# Patient Record
Sex: Male | Born: 1980 | Race: White | Hispanic: No | Marital: Married | Smoking: Never smoker
Health system: Southern US, Community
[De-identification: ages and names within clinical notes are randomized; demographics above are authoritative.]

## PROBLEM LIST (undated history)

## (undated) DIAGNOSIS — K219 Gastro-esophageal reflux disease without esophagitis: Secondary | ICD-10-CM

## (undated) DIAGNOSIS — K589 Irritable bowel syndrome without diarrhea: Secondary | ICD-10-CM

## (undated) HISTORY — PX: HEMORRHOID SURGERY: SHX153

## (undated) HISTORY — PX: TONSILLECTOMY: SUR1361

## (undated) HISTORY — PX: FRACTURE SURGERY: SHX138

---

## 2008-04-20 ENCOUNTER — Emergency Department: Payer: Self-pay | Admitting: Internal Medicine

## 2009-02-09 ENCOUNTER — Emergency Department: Payer: Self-pay | Admitting: Emergency Medicine

## 2009-02-12 ENCOUNTER — Emergency Department: Payer: Self-pay | Admitting: Emergency Medicine

## 2009-06-16 ENCOUNTER — Emergency Department: Payer: Self-pay | Admitting: Emergency Medicine

## 2010-04-08 ENCOUNTER — Emergency Department: Payer: Self-pay | Admitting: Emergency Medicine

## 2010-04-17 ENCOUNTER — Emergency Department: Payer: Self-pay | Admitting: Emergency Medicine

## 2012-08-14 ENCOUNTER — Emergency Department: Payer: Self-pay | Admitting: Internal Medicine

## 2012-08-18 ENCOUNTER — Ambulatory Visit: Payer: Self-pay | Admitting: Orthopedic Surgery

## 2014-05-11 NOTE — Op Note (Signed)
PATIENT NAME:  Ricardo Hammond, Ricardo Hammond MR#:  [id number] DATE OF BIRTH:  [birth year]  DATE OF PROCEDURE:  08/18/2012  PREOPERATIVE DIAGNOSIS:  Fourth and fifth metacarpal fractures.  POSTOPERATIVE DIAGNOSIS:  Fourth and fifth metacarpal fractures.   PROCEDURE:  Closed reduction and percutaneous pinning, right fourth and fifth metacarpal fractures.   ANESTHESIA:  General.   SURGEON:  Leitha SchullerMichael J. Myles Mallicoat, [name]   DESCRIPTION OF PROCEDURE:  The patient was brought to the Operating Room and after adequate anesthesia was obtained the hand was prepped and draped in the usual sterile fashion, appropriate patient identification and procedures were carried out.  On examination with traction and dorsal pressure to the proximal end of the fifth metacarpal, the fifth metacarpal fracture was reduced.  A single K wire was then inserted across the base of the distal fragment into the base of the fourth and third metacarpals.  This appeared to give very good rigidity to this fracture.  Next, the fourth metacarpal distal fracture was reduced with the MCP joint and IP joints in flexion and dorsally directed pressure applied.  Two K wires were then inserted across the fifth metacarpal head and neck into the fourth.  With these two K wires there appeared to be a stable fixation of the fourth metacarpal fracture to stress views with range of motion of the fingers.  All pins were cut short and bent over at this time.  Xeroform was placed around the base of the pins along with 4 x 4's followed by Webril and a volar splint.   ESTIMATED BLOOD LOSS:  Minimal.   COMPLICATIONS:  None.   SPECIMEN:  None.   IMPLANTS:  K wire x 3.    CONDITION:  To recovery room, stable.      ____________________________ Leitha SchullerMichael J. Lavar Rosenzweig, MD mjm:ea D: 08/18/2012 23:18:14 ET T: 08/18/2012 23:37:12 ET JOB#: [id number]  cc: Leitha SchullerMichael J. Jermaine Tholl, MD, <Dictator> Leitha SchullerMICHAEL J Shatarra Wehling MD ELECTRONICALLY SIGNED 08/19/2012 12:52

## 2015-03-11 IMAGING — CR RIGHT HAND - COMPLETE 3+ VIEW
1 series · 3 of 3 positions shown · non-contrast
Comparison: none

REASON FOR EXAM: punched a wall
COMMENTS:

PROCEDURE:     DXR - DXR HAND RT COMPLETE W/OBLIQUES  - August 14, 2012  [DATE]
RESULT:

[Series 1: pa · 0.17mm/px · 3 of 3 slices shown]
[im 1/3]
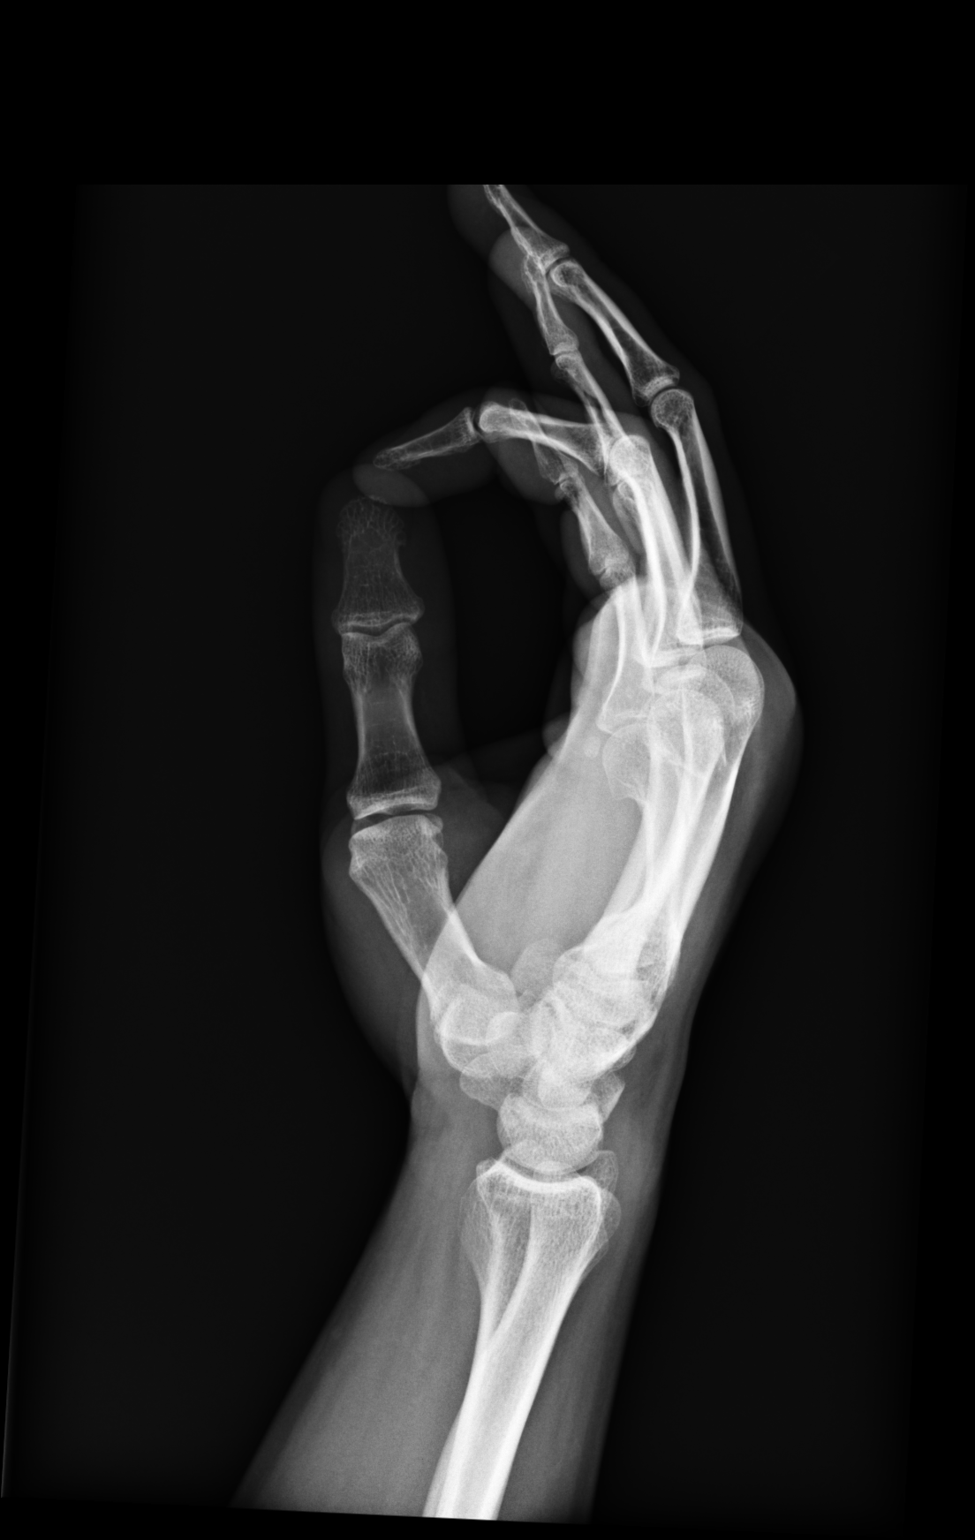
[im 2/3]
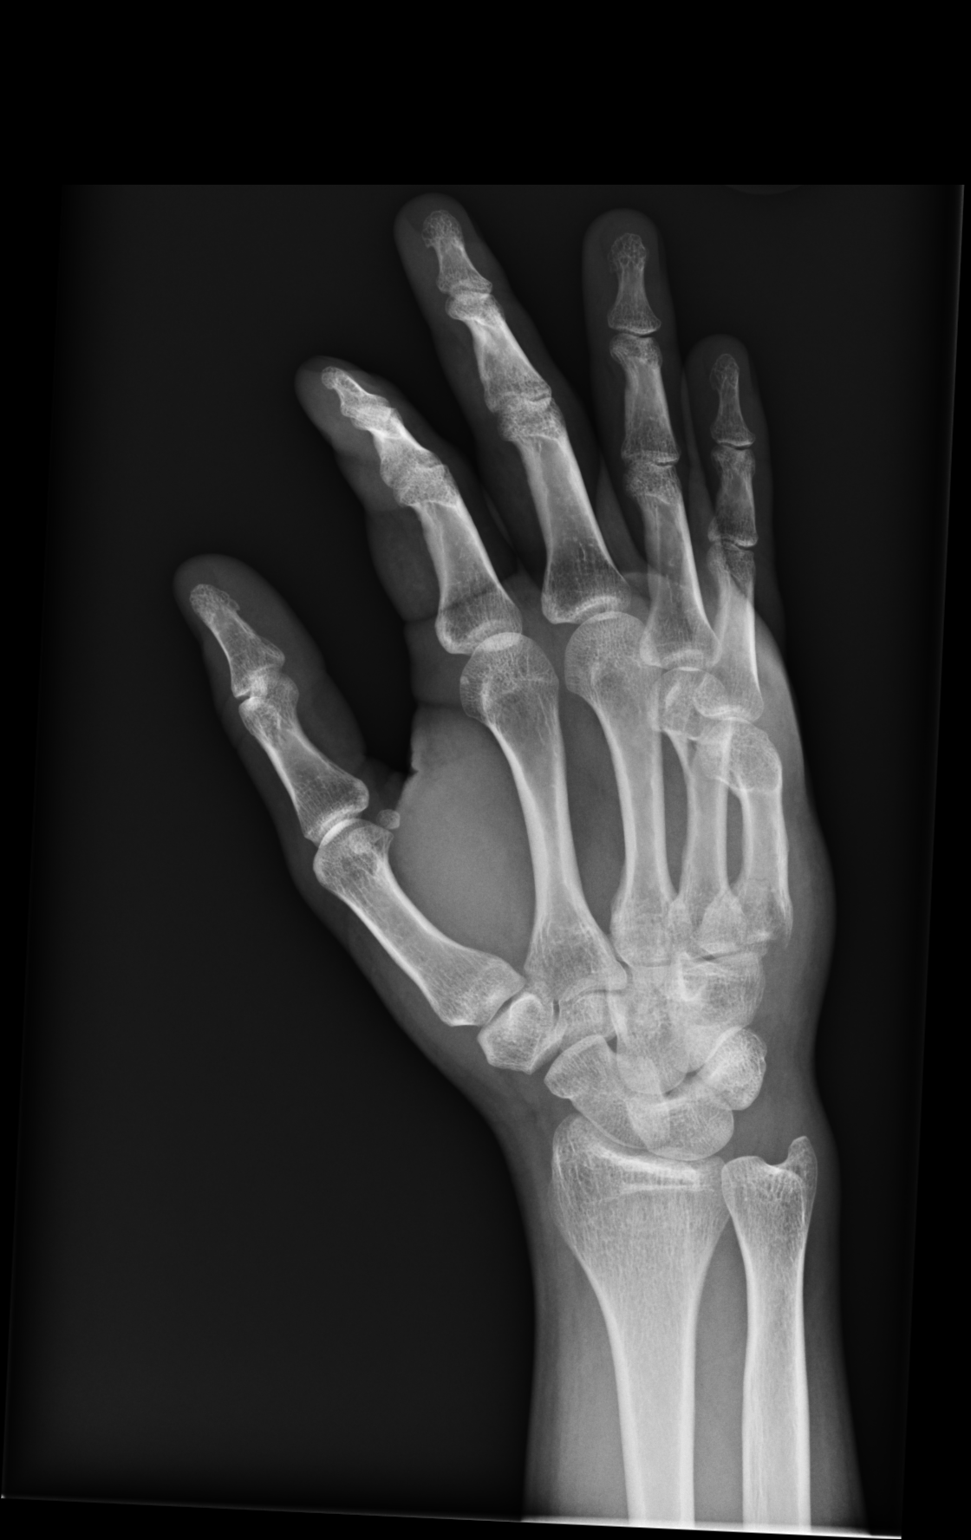
[im 3/3]
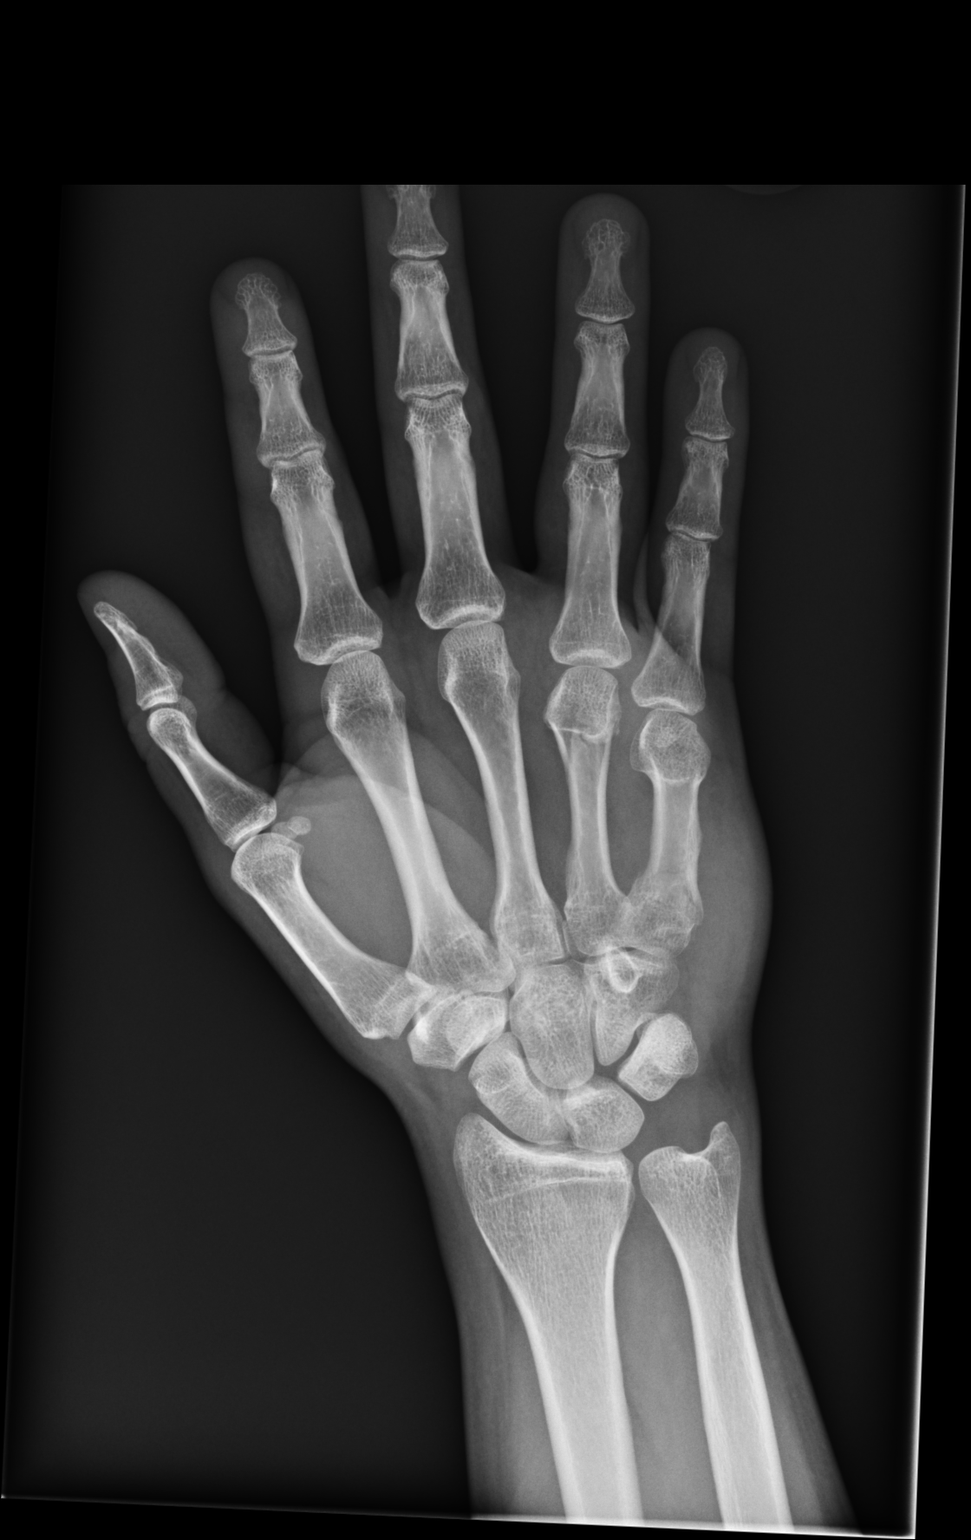

[3 of 3 positions shown; findings below may reference images not displayed]

FINDINGS: A comminuted impacted fracture is identified involving the head of
the fourth metacarpal and base of the fifth metacarpal.
IMPRESSION: Fractures involving the fourth and fifth metacarpals.

## 2017-02-15 ENCOUNTER — Encounter: Payer: Self-pay | Admitting: *Deleted

## 2017-02-15 ENCOUNTER — Ambulatory Visit
Admission: EM | Admit: 2017-02-15 | Discharge: 2017-02-15 | Disposition: A | Discharge status: Home / Self Care | Payer: BLUE CROSS/BLUE SHIELD | Attending: Family Medicine | Admitting: Family Medicine

## 2017-02-15 DIAGNOSIS — M79602 Pain in left arm: Secondary | ICD-10-CM | POA: Diagnosis not present

## 2017-02-15 HISTORY — DX: Gastro-esophageal reflux disease without esophagitis: K21.9

## 2017-02-15 HISTORY — DX: Irritable bowel syndrome, unspecified: K58.9

## 2017-02-15 NOTE — ED Triage Notes (Signed)
While flying home yesterday, pt had gradual onset left hand then arm pain which has worsened through the night. Denies injury.

## 2017-02-15 NOTE — ED Notes (Signed)
Report called to Aleatha BorerMakita, Charity fundraiserN, Press photographercharge nurse.

## 2017-02-15 NOTE — ED Provider Notes (Signed)
[facility] ____________________________________________  Time seen: Approximately 3:30 PM  I have reviewed the triage vital signs and the nursing notes.   HISTORY  Chief Complaint Arm Injury   HPI Ricardo Hammond is a 37 y.o. male presenting with wife at bedside for evaluation of left arm pain.  Patient states he has been having carpal tunnel issues to bilateral wrist for several months, but states was finally evaluated last week.  States he has been having pain and some numbness to his wrist and hands but reports this is different.  Stating that yesterday he noticed some pain to his left wrist while using his computer, but states while he was at the airport he noticed that the pain was continuing only in the left not the right.  States that during his 2 short flights that occurred yesterday he noticed pain increase to left arm starting from hand and then going upwards.  States quick in onset as a whole but states that pain continued to worsen throughout the night last night.  States he did not sleep much last night due to "excruciating" pain.  States pain is present throughout the entire left arm.  Denies any associated chest pain, shortness of breath.  States that he does have full range of motion to the arm but very painful to even move it.  States that he has been guarding left arm movement.  States slight loss of sensation, and wife reporting coloration changes.  No fall, injury, trauma, insect bite.  Denies history of the same in the past.  No personal history of clotting disorders.  Denies cardiac history. Denies recent sickness. Denies recent antibiotic use. No other recent immobilization.    Past Medical History:  Diagnosis Date  . GERD (gastroesophageal reflux disease)   . IBS (irritable bowel syndrome)     There are no active problems to display for this patient.   History reviewed. No pertinent surgical history.   No current facility-administered medications  for this encounter.   Current Outpatient Medications:  .  amphetamine-dextroamphetamine (ADDERALL XR) 20 MG 24 hr capsule, Take 20 mg by mouth daily., Disp: , Rfl:  .  Cetirizine HCl (ZYRTEC ALLERGY PO), Take by mouth., Disp: , Rfl:  .  esomeprazole (NEXIUM) 40 MG capsule, Take 40 mg by mouth daily at 12 noon., Disp: , Rfl:   Allergies Septra [sulfamethoxazole-trimethoprim]  Family History  Problem Relation Age of Onset  . Healthy Mother   . Healthy Father     Social History Social History   Tobacco Use  . Smoking status: Never Smoker  . Smokeless tobacco: Never Used  Substance Use Topics  . Alcohol use: Yes  . Drug use: No    Review of Systems Constitutional: No fever/chills Cardiovascular: Denies chest pain. Respiratory: Denies shortness of breath. Gastrointestinal: No abdominal pain. Musculoskeletal: Negative for back pain. As above.  Skin: Negative for rash. Neurological: Negative for headaches, focal weakness or numbness.   ____________________________________________   PHYSICAL EXAM:  VITAL SIGNS: ED Triage Vitals  Enc Vitals Group     BP 02/15/17 1428 (!) 128/105     Pulse Rate 02/15/17 1428 (!) 106     Resp 02/15/17 1428 16     Temp 02/15/17 1428 98.4 F (36.9 C)     Temp Source 02/15/17 1428 Oral     SpO2 02/15/17 1428 100 %     Weight 02/15/17 1431 186 lb (84.4 kg)     Height 02/15/17 1431 6\' 1"  (1.854 m)  Head Circumference --      Peak Flow --      Pain Score 02/15/17 1430 4     Pain Loc --      Pain Edu? --      Excl. in GC? --     Constitutional: Alert and oriented. Well appearing and in no acute distress. ENT      Head: Normocephalic and atraumatic. Cardiovascular: Normal rate, regular rhythm. Grossly normal heart sounds.  Good peripheral circulation. Respiratory: Normal respiratory effort without tachypnea nor retractions. Breath sounds are clear and equal bilaterally. No wheezes, rales, rhonchi. Gastrointestinal: Soft and  nontender Musculoskeletal: No midline cervical, thoracic or lumbar tenderness to palpation. Bilateral distal radial and brachial pulses equal and easily palpated. Except: Patient guarding left arm and keep it in a fully extended adducted position, purplish coloration noted to distal mid forearm and further distally, left hand distal capillary refill 3-4 seconds, left arm diffusely tender primarily medially at upper arm and medial elbow, no clear ecchymosis or edema noted, no point bony tenderness, slightly weakened left hand grip compared to right, and slight paresthesia noted to left arm compared to right. Neurologic:  Normal speech and language. No gross focal neurologic deficits are appreciated. Speech is normal. No gait instability.  Skin:  Skin is warm, dry and intact. No rash noted. Psychiatric: Mood and affect are normal. Speech and behavior are normal. Patient exhibits appropriate insight and judgment   ___________________________________________   LABS (all labs ordered are listed, but only abnormal results are displayed)  Labs Reviewed - No data to display   PROCEDURES Procedures    INITIAL IMPRESSION / ASSESSMENT AND PLAN / ED COURSE  Pertinent labs & imaging results that were available during my care of the patient were reviewed by me and considered in my medical decision making (see chart for details).  Patient presenting for evaluation of left arm pain.  Patient guarding left arm with diffuse pain, primarily medial aspect.  Purplish discoloration changes noted to distal forearm to left hand.  Discussed multiple differentials, concern for axillo subclavian thrombosis.  Due to concern of thrombosis, recommend further evaluation in emergency room at this time.  [facility] [place] [employer] and given report.  Patient stable at time of discharge, wife to drive patient. Patient verbalized understanding and agreed to plan.    ____________________________________________   FINAL CLINICAL IMPRESSION(S) / ED DIAGNOSES  Final diagnoses:  Left arm pain     ED Discharge Orders    None       Note: This dictation was prepared with Dragon dictation along with smaller phrase technology. Any transcriptional errors that result from this process are unintentional.         Renford DillsMiller, Tallin Hart, NP 02/15/17 1554

## 2017-02-15 NOTE — Discharge Instructions (Signed)
Go directly to Emergency room as discussed.  °

## 2017-09-07 ENCOUNTER — Encounter: Payer: Self-pay | Admitting: Emergency Medicine

## 2017-09-07 ENCOUNTER — Ambulatory Visit
Admission: EM | Admit: 2017-09-07 | Discharge: 2017-09-07 | Disposition: A | Discharge status: Home / Self Care | Payer: BLUE CROSS/BLUE SHIELD | Attending: Family Medicine | Admitting: Family Medicine

## 2017-09-07 ENCOUNTER — Other Ambulatory Visit: Payer: Self-pay

## 2017-09-07 DIAGNOSIS — J0100 Acute maxillary sinusitis, unspecified: Secondary | ICD-10-CM | POA: Diagnosis not present

## 2017-09-07 MED ORDER — FLUTICASONE PROPIONATE 50 MCG/ACT NA SUSP: 2.0000 | Freq: Every day | NASAL | 0 refills | Status: AC

## 2017-09-07 MED ORDER — AMOXICILLIN-POT CLAVULANATE 875-125 MG PO TABS: 1.0000 | ORAL_TABLET | Freq: Two times a day (BID) | ORAL | 0 refills | Status: DC

## 2017-09-07 NOTE — ED Provider Notes (Signed)
[facility]    CSN: [id number] Arrival date & time: 09/07/17  1718     History   Chief Complaint Chief Complaint  Patient presents with  . Sinus Problem    APPT    HPI Thayer HeadingsStephen M Karim is a 37 y.o. male.   HPI  37 year old male presents with a 4-week history of sinus congestion intermittent cough, sore throat, right ear pain, headache, sinus pain and pressure along with production of green-yellow sputum.  He has at times felt feverish but is never taken his temperature.  He is tried over-the-counter medications including Mucinex D and Alka-Seltzer sinus cold but has not been helpful.  He has initially caught a cold from his son never really improved.       Past Medical History:  Diagnosis Date  . GERD (gastroesophageal reflux disease)   . IBS (irritable bowel syndrome)     There are no active problems to display for this patient.   Past Surgical History:  Procedure Laterality Date  . FRACTURE SURGERY    . HEMORRHOID SURGERY    . TONSILLECTOMY         Home Medications    Prior to Admission medications   Medication Sig Start Date End Date Taking? Authorizing Provider  amphetamine-dextroamphetamine (ADDERALL XR) 20 MG 24 hr capsule Take 20 mg by mouth daily.   Yes [provider]  Cetirizine HCl (ZYRTEC ALLERGY PO) Take by mouth.   Yes [provider]  esomeprazole (NEXIUM) 40 MG capsule Take 40 mg by mouth daily at 12 noon.   Yes [provider]  amoxicillin-clavulanate (AUGMENTIN) 875-125 MG tablet Take 1 tablet by mouth every 12 (twelve) hours. 09/07/17   Lutricia Feiloemer, Kemaria Dedic P, PA-C  fluticasone (FLONASE) 50 MCG/ACT nasal spray Place 2 sprays into both nostrils daily. 09/07/17   Lutricia Feiloemer, Marialy Urbanczyk P, PA-C    Family History Family History  Problem Relation Age of Onset  . Healthy Mother   . Healthy Father     Social History Social History   Tobacco Use  . Smoking status: Never Smoker  . Smokeless tobacco: Never Used    Substance Use Topics  . Alcohol use: Never    Frequency: Never  . Drug use: No     Allergies   Septra [sulfamethoxazole-trimethoprim]   Review of Systems Review of Systems  Constitutional: Positive for activity change. Negative for appetite change, chills, fatigue and fever.  HENT: Positive for congestion, ear pain, postnasal drip, rhinorrhea, sinus pressure and sinus pain.   Respiratory: Positive for cough.   All other systems reviewed and are negative.    Physical Exam Triage Vital Signs ED Triage Vitals  Enc Vitals Group     BP 09/07/17 1736 127/81     Pulse Rate 09/07/17 1736 92     Resp 09/07/17 1736 17     Temp 09/07/17 1736 98.1 F (36.7 C)     Temp Source 09/07/17 1736 Oral     SpO2 09/07/17 1736 100 %     Weight 09/07/17 1735 205 lb (93 kg)     Height 09/07/17 1735 6' (1.829 m)     Head Circumference --      Peak Flow --      Pain Score 09/07/17 1733 3     Pain Loc --      Pain Edu? --      Excl. in GC? --    No data found.  Updated Vital Signs BP 127/81 (BP Location: Left Arm)  Pulse 92   Temp 98.1 F (36.7 C) (Oral)   Resp 17   Ht 6' (1.829 m)   Wt 205 lb (93 kg)   SpO2 100%   BMI 27.80 kg/m   Visual Acuity Right Eye Distance:   Left Eye Distance:   Bilateral Distance:    Right Eye Near:   Left Eye Near:    Bilateral Near:     Physical Exam  Constitutional: He is oriented to person, place, and time. He appears well-developed and well-nourished. No distress.  HENT:  Head: Normocephalic.  Right Ear: External ear normal.  Left Ear: External ear normal.  Mouth/Throat: Oropharynx is clear and moist. No oropharyngeal exudate.  Right turbinate is markedly swollen compared to the left.  Eyes: Pupils are equal, round, and reactive to light. Right eye exhibits no discharge. Left eye exhibits no discharge.  Neck: Normal range of motion. Neck supple.  Pulmonary/Chest: Effort normal and breath sounds normal.  Musculoskeletal: Normal range of  motion.  Lymphadenopathy:    He has no cervical adenopathy.  Neurological: He is alert and oriented to person, place, and time.  Skin: Skin is warm and dry. He is not diaphoretic.  Psychiatric: He has a normal mood and affect. His behavior is normal. Judgment and thought content normal.  Nursing note and vitals reviewed.    UC Treatments / Results  Labs (all labs ordered are listed, but only abnormal results are displayed) Labs Reviewed - No data to display  EKG None  Radiology No results found.  Procedures Procedures (including critical care time)  Medications Ordered in UC Medications - No data to display  Initial Impression / Assessment and Plan / UC Course  I have reviewed the triage vital signs and the nursing notes.  Pertinent labs & imaging results that were available during my care of the patient were reviewed by me and considered in my medical decision making (see chart for details).     Plan: 1. Test/x-ray results and diagnosis reviewed with patient 2. rx as per orders; risks, benefits, potential side effects reviewed with patient 3. Recommend supportive treatment with Flonase nasal spray and Claritin Allegra or Zyrtec-D.  Will start on Augmentin for 10 days.  He is not improving he should follow-up with a PCP 4. F/u prn if symptoms worsen or don't improve  Final Clinical Impressions(s) / UC Diagnoses   Final diagnoses:  Acute non-recurrent maxillary sinusitis   Discharge Instructions   None    ED Prescriptions    Medication Sig Dispense Auth. Provider   amoxicillin-clavulanate (AUGMENTIN) 875-125 MG tablet Take 1 tablet by mouth every 12 (twelve) hours. 20 tablet Ovid Curdoemer, Namish Krise P, PA-C   fluticasone (FLONASE) 50 MCG/ACT nasal spray Place 2 sprays into both nostrils daily. 16 g Lutricia Feiloemer, Elek Holderness P, PA-C     Controlled Substance Prescriptions [place] Controlled Substance Registry consulted? Not Applicable   Lutricia FeilRoemer, Karesha Trzcinski P, PA-C 09/07/17 1826

## 2017-09-07 NOTE — ED Triage Notes (Signed)
Pt c/o sinus congestion, cough, sore throat, right ear pain, headache, sinus pain, and pressure. He reports that this started about a month ago. He has been trying OTC medications that would help for a little while but then come back.

## 2018-02-05 ENCOUNTER — Ambulatory Visit
Admission: EM | Admit: 2018-02-05 | Discharge: 2018-02-05 | Disposition: A | Discharge status: Home / Self Care | Payer: BLUE CROSS/BLUE SHIELD | Attending: Family Medicine | Admitting: Family Medicine

## 2018-02-05 ENCOUNTER — Encounter: Payer: Self-pay | Admitting: Gynecology

## 2018-02-05 ENCOUNTER — Other Ambulatory Visit: Payer: Self-pay

## 2018-02-05 DIAGNOSIS — R0981 Nasal congestion: Secondary | ICD-10-CM

## 2018-02-05 DIAGNOSIS — J0100 Acute maxillary sinusitis, unspecified: Secondary | ICD-10-CM | POA: Insufficient documentation

## 2018-02-05 DIAGNOSIS — R05 Cough: Secondary | ICD-10-CM

## 2018-02-05 MED ORDER — AMOXICILLIN-POT CLAVULANATE 875-125 MG PO TABS: 1.0000 | ORAL_TABLET | Freq: Two times a day (BID) | ORAL | 0 refills | Status: AC

## 2018-02-05 NOTE — ED Triage Notes (Signed)
Per patient with cough / right side ear pain x couple days.

## 2018-02-05 NOTE — ED Provider Notes (Signed)
[facility] ____________________________________________  Time seen: Approximately 3:25 PM  I have reviewed the triage vital signs and the nursing notes.   HISTORY  Chief Complaint Cough  HPI Ricardo Hammond is a 38 y.o. male presenting for evaluation of 4 weeks of runny nose, nasal congestion, postnasal drainage and intermittent sinus pressure.  Some coughing.  Denies known fevers.  Occasional scratchy sore throat.  Unresolved with over-the-counter cough and congestion medications.  States daughter has also had some cold-like symptoms as well.  Occasional ear discomfort.  Mild sinus pain currently.  Reports otherwise doing well denies other complaints.   Past Medical History:  Diagnosis Date  . GERD (gastroesophageal reflux disease)   . IBS (irritable bowel syndrome)     There are no active problems to display for this patient.   Past Surgical History:  Procedure Laterality Date  . FRACTURE SURGERY    . HEMORRHOID SURGERY    . TONSILLECTOMY       No current facility-administered medications for this encounter.   Current Outpatient Medications:  .  Cetirizine HCl (ZYRTEC ALLERGY PO), Take by mouth., Disp: , Rfl:  .  esomeprazole (NEXIUM) 40 MG capsule, Take 40 mg by mouth daily at 12 noon., Disp: , Rfl:  .  amoxicillin-clavulanate (AUGMENTIN) 875-125 MG tablet, Take 1 tablet by mouth every 12 (twelve) hours., Disp: 20 tablet, Rfl: 0 .  amphetamine-dextroamphetamine (ADDERALL XR) 20 MG 24 hr capsule, Take 20 mg by mouth daily., Disp: , Rfl:  .  fluticasone (FLONASE) 50 MCG/ACT nasal spray, Place 2 sprays into both nostrils daily., Disp: 16 g, Rfl: 0  Allergies Septra [sulfamethoxazole-trimethoprim]  Family History  Problem Relation Age of Onset  . Healthy Mother   . Healthy Father     Social History Social History   Tobacco Use  . Smoking status: Never Smoker  . Smokeless tobacco: Never Used  Substance Use Topics  . Alcohol use: Never   Frequency: Never  . Drug use: No    Review of Systems Constitutional: No fever ENT: As above.  Cardiovascular: Denies chest pain. Respiratory: Denies shortness of breath. Gastrointestinal: No abdominal pain.  Musculoskeletal: Negative for back pain. Skin: Negative for rash.   ____________________________________________   PHYSICAL EXAM:  VITAL SIGNS: ED Triage Vitals  Enc Vitals Group     BP 02/05/18 1345 (!) 127/92     Pulse Rate 02/05/18 1345 (!) 113     Resp 02/05/18 1345 16     Temp 02/05/18 1345 98.1 F (36.7 C)     Temp Source 02/05/18 1345 Oral     SpO2 02/05/18 1345 97 %     Weight 02/05/18 1342 225 lb (102.1 kg)     Height 02/05/18 1342 6\' 1"  (1.854 m)     Head Circumference --      Peak Flow --      Pain Score 02/05/18 1342 2     Pain Loc --      Pain Edu? --      Excl. in GC? --     Constitutional: Alert and oriented. Well appearing and in no acute distress. Eyes: Conjunctivae are normal.  Head: Atraumatic.Mild to moderate tenderness to palpation bilateral maxillary sinuses. Mild bilateral frontal sinus tenderness palpation.  No swelling. No erythema.   Ears: no erythema, normal TMs bilaterally.   Nose: nasal congestion with bilateral nasal turbinate erythema and edema.   Mouth/Throat: Mucous membranes are moist.  Oropharynx non-erythematous.No tonsillar swelling or exudate.  Neck: No stridor.  No cervical spine tenderness to palpation. Hematological/Lymphatic/Immunilogical: No cervical lymphadenopathy. Cardiovascular: Normal rate, regular rhythm. Grossly normal heart sounds.  Good peripheral circulation. Respiratory: Normal respiratory effort.  No retractions. No wheezes, rales or rhonchi. Good air movement.  Musculoskeletal: No cervical, thoracic or lumbar tenderness to palpation.  Neurologic:  Normal speech and language.No gait instability. Skin:  Skin is warm, dry and intact. No rash noted. Psychiatric: Mood and affect are normal. Speech and behavior  are normal. ___________________________________________   LABS (all labs ordered are listed, but only abnormal results are displayed)  Labs Reviewed - No data to display  PROCEDURES Procedures   INITIAL IMPRESSION / ASSESSMENT AND PLAN / ED COURSE  Pertinent labs & imaging results that were available during my care of the patient were reviewed by me and considered in my medical decision making (see chart for details).  Well-appearing patient.  No acute distress.  Suspect sinusitis.  Will treat with oral Augmentin.  Encourage supportive care, over-the-counter decongestants.Discussed indication, risks and benefits of medications with patient.  Discussed follow up with Primary care physician this week. Discussed follow up and return parameters including no resolution or any worsening concerns. Patient verbalized understanding and agreed to plan.   ____________________________________________   FINAL CLINICAL IMPRESSION(S) / ED DIAGNOSES  Final diagnoses:  Acute maxillary sinusitis, recurrence not specified     ED Discharge Orders         Ordered    amoxicillin-clavulanate (AUGMENTIN) 875-125 MG tablet  Every 12 hours     02/05/18 1437           Note: This dictation was prepared with Dragon dictation along with smaller phrase technology. Any transcriptional errors that result from this process are unintentional.         Renford Dills, NP 02/05/18 1531

## 2018-02-05 NOTE — Discharge Instructions (Addendum)
Take medication as prescribed.  Over-the-counter decongestants as needed.  Follow up with your primary care physician this week as needed. Return to Urgent care for new or worsening concerns.
